# Patient Record
Sex: Male | Born: 1990 | Race: Black or African American | Hispanic: No | Marital: Single | Smoking: Current some day smoker
Health system: Southern US, Community
[De-identification: ages and names within clinical notes are randomized; demographics above are authoritative.]

## PROBLEM LIST (undated history)

## (undated) DIAGNOSIS — G44009 Cluster headache syndrome, unspecified, not intractable: Secondary | ICD-10-CM

## (undated) HISTORY — PX: MASTECTOMY: SHX3

---
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
## 2008-05-08 ENCOUNTER — Emergency Department (HOSPITAL_COMMUNITY): Admission: EM | Admit: 2008-05-08 | Discharge: 2008-05-08 | Payer: Self-pay | Admitting: Emergency Medicine

## 2008-05-18 ENCOUNTER — Emergency Department (HOSPITAL_COMMUNITY): Admission: EM | Admit: 2008-05-18 | Discharge: 2008-05-18 | Payer: Self-pay | Admitting: Emergency Medicine

## 2009-05-09 ENCOUNTER — Emergency Department (HOSPITAL_COMMUNITY): Admission: EM | Admit: 2009-05-09 | Discharge: 2009-05-09 | Payer: Self-pay | Admitting: Emergency Medicine

## 2010-04-12 LAB — URINALYSIS, ROUTINE W REFLEX MICROSCOPIC
Bilirubin Urine: NEGATIVE
Glucose, UA: NEGATIVE mg/dL
Hgb urine dipstick: NEGATIVE
Ketones, ur: NEGATIVE mg/dL
Nitrite: NEGATIVE
Protein, ur: NEGATIVE mg/dL
Specific Gravity, Urine: 1.024 (ref 1.005–1.030)
Urobilinogen, UA: 1 mg/dL (ref 0.0–1.0)
pH: 6 (ref 5.0–8.0)

## 2010-04-12 LAB — GC/CHLAMYDIA PROBE AMP, GENITAL
Chlamydia, DNA Probe: NEGATIVE
GC Probe Amp, Genital: NEGATIVE

## 2010-04-12 LAB — RPR: RPR Ser Ql: NONREACTIVE

## 2010-05-04 LAB — RAPID URINE DRUG SCREEN, HOSP PERFORMED
Amphetamines: NOT DETECTED
Barbiturates: NOT DETECTED
Opiates: NOT DETECTED

## 2010-10-12 ENCOUNTER — Emergency Department (HOSPITAL_COMMUNITY): Payer: Self-pay

## 2010-10-12 ENCOUNTER — Emergency Department (HOSPITAL_COMMUNITY)
Admission: EM | Admit: 2010-10-12 | Discharge: 2010-10-12 | Disposition: A | Discharge status: Home / Self Care | Payer: No Typology Code available for payment source | Attending: Emergency Medicine | Admitting: Emergency Medicine

## 2010-10-12 DIAGNOSIS — M25519 Pain in unspecified shoulder: Secondary | ICD-10-CM | POA: Insufficient documentation
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
## 2015-08-15 ENCOUNTER — Encounter (HOSPITAL_COMMUNITY): Payer: Self-pay | Admitting: Emergency Medicine

## 2015-08-15 DIAGNOSIS — F17200 Nicotine dependence, unspecified, uncomplicated: Secondary | ICD-10-CM | POA: Insufficient documentation

## 2015-08-15 DIAGNOSIS — R42 Dizziness and giddiness: Secondary | ICD-10-CM | POA: Insufficient documentation

## 2015-08-15 LAB — COMPREHENSIVE METABOLIC PANEL
ALT: 27 U/L (ref 17–63)
AST: 28 U/L (ref 15–41)
Albumin: 4.2 g/dL (ref 3.5–5.0)
Alkaline Phosphatase: 69 U/L (ref 38–126)
Anion gap: 6 (ref 5–15)
BUN: 9 mg/dL (ref 6–20)
CHLORIDE: 106 mmol/L (ref 101–111)
CO2: 27 mmol/L (ref 22–32)
CREATININE: 1.13 mg/dL (ref 0.61–1.24)
Calcium: 9.6 mg/dL (ref 8.9–10.3)
GFR calc Af Amer: >60 mL/min (ref >60)
Glucose, Bld: 97 mg/dL (ref 65–99)
POTASSIUM: 3.5 mmol/L (ref 3.5–5.1)
Sodium: 139 mmol/L (ref 135–145)
Total Bilirubin: 0.6 mg/dL (ref 0.3–1.2)
Total Protein: 8 g/dL (ref 6.5–8.1)

## 2015-08-15 LAB — CBC WITH DIFFERENTIAL/PLATELET
BASOS ABS: 0 10*3/uL (ref 0.0–0.1)
Basophils Relative: 0 %
Eosinophils Absolute: 0.3 10*3/uL (ref 0.0–0.7)
Eosinophils Relative: 3 %
HEMATOCRIT: 43.8 % (ref 39.0–52.0)
HEMOGLOBIN: 14.3 g/dL (ref 13.0–17.0)
LYMPHS PCT: 30 %
Lymphs Abs: 3 10*3/uL (ref 0.7–4.0)
MCH: 28.5 pg (ref 26.0–34.0)
MCHC: 32.6 g/dL (ref 30.0–36.0)
MCV: 87.4 fL (ref 78.0–100.0)
Monocytes Absolute: 0.6 10*3/uL (ref 0.1–1.0)
Monocytes Relative: 6 %
NEUTROS ABS: 6 10*3/uL (ref 1.7–7.7)
NEUTROS PCT: 61 %
Platelets: 282 10*3/uL (ref 150–400)
RBC: 5.01 MIL/uL (ref 4.22–5.81)
RDW: 13.1 % (ref 11.5–15.5)
WBC: 9.9 10*3/uL (ref 4.0–10.5)

## 2015-08-15 LAB — URINALYSIS, ROUTINE W REFLEX MICROSCOPIC
BILIRUBIN URINE: NEGATIVE
Glucose, UA: NEGATIVE mg/dL
HGB URINE DIPSTICK: NEGATIVE
Ketones, ur: NEGATIVE mg/dL
Leukocytes, UA: NEGATIVE
NITRITE: NEGATIVE
PH: 6 (ref 5.0–8.0)
Protein, ur: NEGATIVE mg/dL
SPECIFIC GRAVITY, URINE: 1.03 (ref 1.005–1.030)

## 2015-08-15 NOTE — ED Triage Notes (Signed)
Patient reports intermittent lightheaded / dizziness with nausea onset 2 weeks ago , denies emesis . No fever .

## 2015-08-16 ENCOUNTER — Emergency Department (HOSPITAL_COMMUNITY)
Admission: EM | Admit: 2015-08-16 | Discharge: 2015-08-16 | Disposition: A | Discharge status: Home / Self Care | Payer: Self-pay | Attending: Emergency Medicine | Admitting: Emergency Medicine

## 2015-08-16 DIAGNOSIS — R42 Dizziness and giddiness: Secondary | ICD-10-CM

## 2015-08-16 MED ORDER — SODIUM CHLORIDE 0.9 % IV BOLUS (SEPSIS)
1000.0000 mL | Freq: Once | INTRAVENOUS | Status: AC
  Administered 2015-08-16: 1000 mL via INTRAVENOUS

## 2015-08-16 NOTE — Discharge Instructions (Signed)
FOLLOW UP HERE WITH ANY WORSENING OR RECURRENT SYMPTOMS OF LIGHTHEADEDNESS/DIZZINESS. PUSH FLUIDS - NON-CAFFEINATED.

## 2015-08-30 NOTE — ED Provider Notes (Signed)
[facility]-EMERGENCY DEPT Provider Note   CSN: [id number] Arrival date & time: 08/15/15  2141  First Provider Contact:  First MD Initiated Contact with Patient 08/16/15 [phone]        History   Chief Complaint Chief Complaint  Patient presents with  . Dizziness    HPI Clarence Henderson is a 25 y.o. male.  Patient presents in the ED with complaint of dizziness and lightheadedness x 2 weeks, waxing and waning. No fever, nausea or vomiting. No pain. He denies urinary complaints, diarrhea or headache. No visual changes.    The history is provided by the patient. No language interpreter was used.    History reviewed. No pertinent past medical history.  There are no active problems to display for this patient.   History reviewed. No pertinent surgical history.     Home Medications    Prior to Admission medications   Not on File    Family History No family history on file.  Social History Social History  Substance Use Topics  . Smoking status: Current Some Day Smoker  . Smokeless tobacco: Not on file  . Alcohol use Yes     Allergies   Review of patient's allergies indicates no known allergies.   Review of Systems Review of Systems  Constitutional: Negative for chills and fever.  Respiratory: Negative.  Negative for shortness of breath.   Cardiovascular: Negative.  Negative for chest pain.  Gastrointestinal: Negative.   Genitourinary: Negative.   Musculoskeletal: Negative.   Skin: Negative.   Neurological: Positive for dizziness. Negative for syncope and weakness.     Physical Exam Updated Vital Signs BP 124/79 (BP Location: Left Arm)   Pulse 82   Temp 98.5 F (36.9 C) (Oral)   Resp 23   Ht 6' (1.829 m)   Wt 133.8 kg   SpO2 100%   BMI 40.01 kg/m   Physical Exam  Constitutional: He is oriented to person, place, and time. He appears well-developed and well-nourished.  HENT:  Head: Normocephalic.  Mouth/Throat: Mucous membranes are dry.  Neck:  Normal range of motion. Neck supple.  Cardiovascular: Normal rate and regular rhythm.   Pulmonary/Chest: Effort normal and breath sounds normal.  Abdominal: Soft. Bowel sounds are normal. There is no tenderness. There is no rebound and no guarding.  Musculoskeletal: Normal range of motion.  Neurological: He is alert and oriented to person, place, and time. Coordination normal.  CN's 3-12 grossly intact. Ambulatory without ataxia. Speech clear, focused and coherent.   Skin: Skin is warm and dry. No rash noted.  Psychiatric: He has a normal mood and affect.  Nursing note reviewed.    ED Treatments / Results  Labs (all labs ordered are listed, but only abnormal results are displayed) Labs Reviewed  CBC WITH DIFFERENTIAL/PLATELET  COMPREHENSIVE METABOLIC PANEL  URINALYSIS, ROUTINE W REFLEX MICROSCOPIC (NOT AT [facility])    EKG  EKG Interpretation None       Radiology No results found.  Procedures Procedures (including critical care time)  Medications Ordered in ED Medications  sodium chloride 0.9 % bolus 1,000 mL (0 mLs Intravenous Stopped 08/16/15 0612)     Initial Impression / Assessment and Plan / ED Course  I have reviewed the triage vital signs and the nursing notes.  Pertinent labs & imaging results that were available during my care of the patient were reviewed by me and considered in my medical decision making (see chart for details).  Clinical Course  Comment By Time  Pt  not in the room at 12:27.  Elpidio AnisShari Chaston Bradburn, PA-C 07/24 808 [phone]  Patient not in the room at 1:00 Elpidio AnisShari Milderd Manocchio, PA-C 07/24 0120  Patient in room and initially seen at 1:40 Elpidio AnisShari Marlyne Totaro, PA-C 07/24 0158    Patient arrives with complaint of dizziness and lightheadedness without syncope or pain. He is given IVF's with relief of his symptoms. He has no neurologic deficits on exam. He is ambulating without imbalance or difficulty. He is felt stable for discharge home.   Final Clinical Impressions(s) / ED  Diagnoses   Final diagnoses:  Dizziness    New Prescriptions There are no discharge medications for this patient.    Elpidio AnisShari Nikea Settle, PA-C 08/30/15 [phone]    Shon Batonourtney F Horton, MD 09/05/15 [phone]

## 2018-12-02 ENCOUNTER — Other Ambulatory Visit: Payer: Self-pay

## 2018-12-02 DIAGNOSIS — Z20822 Contact with and (suspected) exposure to covid-19: Secondary | ICD-10-CM

## 2018-12-05 LAB — NOVEL CORONAVIRUS, NAA: SARS-CoV-2, NAA: NOT DETECTED

## 2019-03-10 ENCOUNTER — Ambulatory Visit: Payer: Self-pay | Attending: Internal Medicine

## 2019-03-10 DIAGNOSIS — U071 COVID-19: Secondary | ICD-10-CM | POA: Insufficient documentation

## 2019-03-10 DIAGNOSIS — Z20822 Contact with and (suspected) exposure to covid-19: Secondary | ICD-10-CM

## 2019-03-11 LAB — NOVEL CORONAVIRUS, NAA: SARS-CoV-2, NAA: DETECTED — AB

## 2019-09-04 ENCOUNTER — Ambulatory Visit (INDEPENDENT_AMBULATORY_CARE_PROVIDER_SITE_OTHER): Payer: BC Managed Care – PPO

## 2019-09-04 ENCOUNTER — Other Ambulatory Visit: Payer: Self-pay | Admitting: Sports Medicine

## 2019-09-04 ENCOUNTER — Ambulatory Visit (INDEPENDENT_AMBULATORY_CARE_PROVIDER_SITE_OTHER): Payer: BC Managed Care – PPO | Admitting: Sports Medicine

## 2019-09-04 ENCOUNTER — Encounter: Payer: Self-pay | Admitting: Sports Medicine

## 2019-09-04 ENCOUNTER — Other Ambulatory Visit: Payer: Self-pay

## 2019-09-04 DIAGNOSIS — M216X2 Other acquired deformities of left foot: Secondary | ICD-10-CM

## 2019-09-04 DIAGNOSIS — M216X1 Other acquired deformities of right foot: Secondary | ICD-10-CM | POA: Diagnosis not present

## 2019-09-04 DIAGNOSIS — M79671 Pain in right foot: Secondary | ICD-10-CM | POA: Diagnosis not present

## 2019-09-04 DIAGNOSIS — M722 Plantar fascial fibromatosis: Secondary | ICD-10-CM

## 2019-09-04 MED ORDER — MELOXICAM 15 MG PO TABS: 15.0000 mg | ORAL_TABLET | Freq: Every day | ORAL | 0 refills | Status: DC

## 2019-09-04 MED ORDER — TRIAMCINOLONE ACETONIDE 10 MG/ML IJ SUSP
10.0000 mg | Freq: Once | INTRAMUSCULAR | Status: AC
  Administered 2019-09-04: 10 mg

## 2019-09-04 NOTE — Progress Notes (Signed)
Subjective: Clarence Henderson is a 29 y.o. male patient presents to office with complaint of moderate heel pain on the right since March 2021. 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  Patient reports that he bought over-the-counter insoles which made the pain worse and also got a fascial sleeve that seems to help however after periods of rest and after football the pain is worse.  Review of Systems  All other systems reviewed and are negative.    There are no problems to display for this patient.   No current outpatient medications on file prior to visit.   No current facility-administered medications on file prior to visit.    No Known Allergies  Objective: Physical Exam General: The patient is alert and oriented x3 in no acute distress.  Dermatology: Skin is warm, dry and supple bilateral lower extremities. Nails 1-10 are normal. There is no erythema, edema, no eccymosis, no open lesions present. Integument is otherwise unremarkable.  Vascular: Dorsalis Pedis pulse and Posterior Tibial pulse are 2/4 bilateral. Capillary fill time is immediate to all digits.  Neurological: Grossly intact to light touch bilateral.  Musculoskeletal: Tenderness to palpation at the medial calcaneal tubercale and through the insertion of the plantar fascia on the Right foot. No pain with compression of calcaneus bilateral. No pain with tuning fork to calcaneus bilateral. No pain with calf compression bilateral. There is decreased Ankle joint range of motion bilateral.  Pes planus foot type.  All other joints range of motion within normal limits bilateral. Strength 5/5 in all groups bilateral.    Xray, Right foot: Normal osseous mineralization. Joint spaces preserved. No fracture/dislocation/boney destruction. No calcaneal spur present with mild thickening of plantar fascia. No other soft tissue abnormalities or radiopaque  foreign bodies.   Assessment and Plan: Problem List Items Addressed This Visit    None    Visit Diagnoses    Pain in right foot    -  Primary   Relevant Orders   DG Foot Complete Right   Plantar fasciitis of right foot       Relevant Medications   meloxicam (MOBIC) 15 MG tablet   triamcinolone acetonide (KENALOG) 10 MG/ML injection 10 mg (Start on 09/04/2019  4:45 PM)   Acquired equinus deformity of both feet          -Complete examination performed.  -Xrays reviewed -Discussed with patient in detail the condition of plantar fasciitis, how this occurs and general treatment options. Explained both conservative and surgical treatments.  -After oral consent and aseptic prep, injected a mixture containing 1 ml of 2%  plain lidocaine, 1 ml 0.5% plain marcaine, 0.5 ml of kenalog 10 and 0.5 ml of dexamethasone phosphate into right heel. Post-injection care discussed with patient.  -Rx Meloxicam -Patient to continue with plantar fascial sleeve that he already has.  -Explained and dispensed to patient daily stretching exercises. -Recommend patient to ice affected area 1-2x daily. -Patient to return to office in 4-5 weeks for follow up or sooner if pro pain blems or questions arise.  Asencion Islam, DPM

## 2019-09-04 NOTE — Patient Instructions (Addendum)

## 2019-10-09 ENCOUNTER — Ambulatory Visit: Payer: BC Managed Care – PPO | Admitting: Sports Medicine

## 2019-10-30 ENCOUNTER — Ambulatory Visit: Payer: Self-pay | Admitting: Sports Medicine

## 2021-05-12 ENCOUNTER — Ambulatory Visit (INDEPENDENT_AMBULATORY_CARE_PROVIDER_SITE_OTHER): Payer: No Typology Code available for payment source | Admitting: Family Medicine

## 2021-05-12 ENCOUNTER — Encounter (HOSPITAL_BASED_OUTPATIENT_CLINIC_OR_DEPARTMENT_OTHER): Payer: Self-pay | Admitting: Family Medicine

## 2021-05-12 VITALS — BP 136/97 | HR 88 | Temp 98.6°F | Ht 74.0 in | Wt 295.0 lb

## 2021-05-12 DIAGNOSIS — Z0000 Encounter for general adult medical examination without abnormal findings: Secondary | ICD-10-CM

## 2021-05-12 NOTE — Progress Notes (Signed)
? ?New Patient Office Visit ? ?Subjective   ? ?Patient ID: Clarence Henderson, male    DOB: 1990/04/30  Age: 31 y.o. MRN: [id number] ? ?CC: No chief complaint on file. ? ? ?HPI ?Clarence Henderson presents to establish care. ? ?He reports that his last PCP visit was about 2 and half to 3 years ago.  Denies any chronic medical issues, was not following up for any specific issues ?He would like to have labs completed, complete physical ?Patient is originally from [place], has been living here for about 20 years.  He works as a Secretary/administrator.  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 ?Reports some family history of blood pressure issues he thinks, possible blood sugar issues in grandparent.  Not aware of any specific cancers in the family. ? ?Outpatient Encounter Medications as of 05/12/2021  ?Medication Sig  ? meloxicam (MOBIC) 15 MG tablet Take 1 tablet (15 mg total) by mouth daily.  ? ?No facility-administered encounter medications on file as of 05/12/2021.  ? ? ?History reviewed. No pertinent past medical history. ? ?History reviewed. No pertinent surgical history. ? ?History reviewed. No pertinent family history. ? ?Social History  ? ?Socioeconomic History  ? Marital status: Single  ?  Spouse name: Not on file  ? Number of children: Not on file  ? Years of education: Not on file  ? Highest education level: Not on file  ?Occupational History  ? Not on file  ?Tobacco Use  ? Smoking status: Some Days  ? Smokeless tobacco: Not on file  ?Substance and Sexual Activity  ? Alcohol use: Yes  ? Drug use: No  ? Sexual activity: Not on file  ?Other Topics Concern  ? Not on file  ?Social History Narrative  ? Not on file  ? ?Social Determinants of Health  ? ?Financial Resource Strain: Not on file  ?Food Insecurity: Not on file  ?Transportation Needs: Not on file  ?Physical Activity: Not on file  ?Stress: Not on file  ?Social Connections: Not on file  ?Intimate Partner Violence: Not on  file  ? ? ?Objective   ? ?BP (!) 136/97   Pulse 88   Temp 98.6 ?F (37 ?C)   Ht 6\' 2"  (1.88 m)   Wt 295 lb (133.8 kg)   SpO2 99%   BMI 37.88 kg/m?  ? ?Physical Exam ?Constitutional:   ?   General: He is not in acute distress. ?   Appearance: He is obese.  ?HENT:  ?   Head: Normocephalic and atraumatic.  ?   Right Ear: Tympanic membrane, ear canal and external ear normal.  ?   Left Ear: Tympanic membrane, ear canal and external ear normal.  ?   Nose: Nose normal.  ?   Mouth/Throat:  ?   Mouth: Mucous membranes are moist.  ?   Pharynx: Oropharynx is clear.  ?Eyes:  ?   Extraocular Movements: Extraocular movements intact.  ?   Conjunctiva/sclera: Conjunctivae normal.  ?   Pupils: Pupils are equal, round, and reactive to light.  ?Cardiovascular:  ?   Rate and Rhythm: Normal rate and regular rhythm.  ?   Pulses: Normal pulses.  ?   Heart sounds: Normal heart sounds. No murmur heard. ?Pulmonary:  ?   Effort: Pulmonary effort is normal.  ?   Breath sounds: Normal breath sounds. No wheezing.  ?Abdominal:  ?   General: Bowel sounds are normal. There is no distension.  ?  Palpations: Abdomen is soft.  ?   Tenderness: There is no abdominal tenderness. There is no guarding.  ?Musculoskeletal:  ?   Cervical back: Normal range of motion. No tenderness.  ?Skin: ?   General: Skin is warm.  ?   Capillary Refill: Capillary refill takes less than 2 seconds.  ?   Coloration: Skin is not jaundiced.  ?   Findings: No rash.  ?Neurological:  ?   General: No focal deficit present.  ?   Mental Status: He is alert and oriented to person, place, and time.  ?   Gait: Gait normal.  ?Psychiatric:     ?   Mood and Affect: Mood normal.     ?   Behavior: Behavior normal.  ? ? ?Assessment & Plan:  ? ?Problem List Items Addressed This Visit   ? ?  ? Other  ? Wellness examination - Primary  ?  Routine HCM labs ordered. HCM reviewed/discussed. Anticipatory guidance regarding healthy weight, lifestyle and choices given. ?Recommend healthy diet.   Recommend approximately 150 minutes/week of moderate intensity exercise ?Recommend regular dental and vision exams ?Always use seatbelt/lap and shoulder restraints ?Recommend using smoke alarms and checking batteries at least twice a year ?Recommend using sunscreen when outside ?[facility] - reports receiving this in 2015 ?  ?  ? Relevant Orders  ? CBC with Differential/Platelet  ? Comprehensive metabolic panel  ? Hemoglobin A1c  ? Lipid panel  ? TSH Rfx on Abnormal to Free T4  ? ? ?Return in about 1 year (around 05/13/2022).  Or sooner as needed or as indicated by laboratory evaluation ? ?Ramsey Guadamuz J De Peru, MD ? ? ?

## 2021-05-12 NOTE — Assessment & Plan Note (Signed)
Routine HCM labs ordered. HCM reviewed/discussed. Anticipatory guidance regarding healthy weight, lifestyle and choices given. ?Recommend healthy diet.  Recommend approximately 150 minutes/week of moderate intensity exercise ?Recommend regular dental and vision exams ?Always use seatbelt/lap and shoulder restraints ?Recommend using smoke alarms and checking batteries at least twice a year ?Recommend using sunscreen when outside ?[employer] - reports receiving this in 2015 ?

## 2021-05-13 LAB — CBC WITH DIFFERENTIAL/PLATELET
Basophils Absolute: 0.1 10*3/uL (ref 0.0–0.2)
Basos: 1 %
EOS (ABSOLUTE): 0.1 10*3/uL (ref 0.0–0.4)
Eos: 1 %
Hematocrit: 43 % (ref 37.5–51.0)
Hemoglobin: 14.5 g/dL (ref 13.0–17.7)
Immature Grans (Abs): 0 10*3/uL (ref 0.0–0.1)
Immature Granulocytes: 0 %
Lymphocytes Absolute: 2 10*3/uL (ref 0.7–3.1)
Lymphs: 25 %
MCH: 28.4 pg (ref 26.6–33.0)
MCHC: 33.7 g/dL (ref 31.5–35.7)
MCV: 84 fL (ref 79–97)
Monocytes Absolute: 0.6 10*3/uL (ref 0.1–0.9)
Monocytes: 8 %
Neutrophils Absolute: 5.1 10*3/uL (ref 1.4–7.0)
Neutrophils: 65 %
Platelets: 259 10*3/uL (ref 150–450)
RBC: 5.1 x10E6/uL (ref 4.14–5.80)
RDW: 12.2 % (ref 11.6–15.4)
WBC: 7.8 10*3/uL (ref 3.4–10.8)

## 2021-05-13 LAB — COMPREHENSIVE METABOLIC PANEL
ALT: 20 IU/L (ref 0–44)
AST: 25 IU/L (ref 0–40)
Albumin/Globulin Ratio: 1.4 (ref 1.2–2.2)
Albumin: 4.3 g/dL (ref 4.0–5.0)
Alkaline Phosphatase: 70 IU/L (ref 44–121)
BUN/Creatinine Ratio: 8 — ABNORMAL LOW (ref 9–20)
BUN: 9 mg/dL (ref 6–20)
Bilirubin Total: 0.4 mg/dL (ref 0.0–1.2)
CO2: 24 mmol/L (ref 20–29)
Calcium: 9.2 mg/dL (ref 8.7–10.2)
Chloride: 103 mmol/L (ref 96–106)
Creatinine, Ser: 1.17 mg/dL (ref 0.76–1.27)
Globulin, Total: 3.1 g/dL (ref 1.5–4.5)
Glucose: 80 mg/dL (ref 70–99)
Potassium: 4.2 mmol/L (ref 3.5–5.2)
Sodium: 139 mmol/L (ref 134–144)
Total Protein: 7.4 g/dL (ref 6.0–8.5)
eGFR: 84 mL/min/{1.73_m2} (ref >59)

## 2021-05-13 LAB — LIPID PANEL
Chol/HDL Ratio: 5.6 ratio — ABNORMAL HIGH (ref 0.0–5.0)
Cholesterol, Total: 222 mg/dL — ABNORMAL HIGH (ref 100–199)
HDL: 40 mg/dL (ref >39)
LDL Chol Calc (NIH): 167 mg/dL — ABNORMAL HIGH (ref 0–99)
Triglycerides: 85 mg/dL (ref 0–149)
VLDL Cholesterol Cal: 15 mg/dL (ref 5–40)

## 2021-05-13 LAB — HEMOGLOBIN A1C
Est. average glucose Bld gHb Est-mCnc: 103 mg/dL
Hgb A1c MFr Bld: 5.2 % (ref 4.8–5.6)

## 2021-05-13 LAB — TSH RFX ON ABNORMAL TO FREE T4: TSH: 1.74 u[IU]/mL (ref 0.450–4.500)

## 2021-05-19 ENCOUNTER — Telehealth (HOSPITAL_BASED_OUTPATIENT_CLINIC_OR_DEPARTMENT_OTHER): Payer: Self-pay

## 2021-05-19 NOTE — Telephone Encounter (Signed)
Pt called back and was notified about his lab results. Pt understood and was told to call back if he has any other questions or concerns.  ?

## 2021-05-19 NOTE — Progress Notes (Signed)
Pt called back today in regards to his lab results he was notified about them and recommendations on what to do to keep it under control. Pt stated he understood and will call back if he has any questions or concerns.  ?   ?

## 2022-05-15 ENCOUNTER — Encounter (HOSPITAL_BASED_OUTPATIENT_CLINIC_OR_DEPARTMENT_OTHER): Payer: No Typology Code available for payment source | Admitting: Family Medicine

## 2022-06-24 LAB — GLUCOSE, POCT (MANUAL RESULT ENTRY): Glucose Fasting, POC: 93 mg/dL (ref 70–99)

## 2022-06-26 NOTE — Progress Notes (Unsigned)
Patient states Dr. Michaell Cowing PCP. No SDOH needs at this time.

## 2022-07-31 ENCOUNTER — Encounter: Payer: Self-pay | Admitting: *Deleted

## 2022-07-31 NOTE — Progress Notes (Unsigned)
Pt attended 06/24/22 screening event where his b/p was 130/96 and his blood sugar was 93 and where pt did not indicated any SDOH insecurities. At the event, the pt documented Dr. Michaell Cowing as his PCP;however, chart review indicated Dr. Ceasar Mons Peru was pt's PCP at [facility], where he was last seen on 05/12/21. During that visit pt's b/p was also elevated at 136/97. Calls made to determine if pt does indeed have access to a current PCP and whether he has been able to share his event b/p, which was elevated.

## 2023-03-21 ENCOUNTER — Encounter (HOSPITAL_COMMUNITY): Payer: Self-pay | Admitting: Emergency Medicine

## 2023-03-21 ENCOUNTER — Other Ambulatory Visit: Payer: Self-pay

## 2023-03-21 ENCOUNTER — Other Ambulatory Visit (HOSPITAL_BASED_OUTPATIENT_CLINIC_OR_DEPARTMENT_OTHER): Payer: Self-pay

## 2023-03-21 ENCOUNTER — Emergency Department (HOSPITAL_COMMUNITY)
Admission: EM | Admit: 2023-03-21 | Discharge: 2023-03-21 | Disposition: A | Discharge status: Home / Self Care | Payer: Medicaid Other | Attending: Emergency Medicine | Admitting: Emergency Medicine

## 2023-03-21 ENCOUNTER — Encounter (HOSPITAL_BASED_OUTPATIENT_CLINIC_OR_DEPARTMENT_OTHER): Payer: Self-pay

## 2023-03-21 ENCOUNTER — Other Ambulatory Visit (HOSPITAL_COMMUNITY): Payer: Self-pay

## 2023-03-21 ENCOUNTER — Emergency Department (HOSPITAL_BASED_OUTPATIENT_CLINIC_OR_DEPARTMENT_OTHER)
Admission: EM | Admit: 2023-03-21 | Discharge: 2023-03-21 | Disposition: A | Discharge status: Home / Self Care | Payer: Medicaid Other | Attending: Emergency Medicine | Admitting: Emergency Medicine

## 2023-03-21 DIAGNOSIS — G44009 Cluster headache syndrome, unspecified, not intractable: Secondary | ICD-10-CM | POA: Insufficient documentation

## 2023-03-21 DIAGNOSIS — R519 Headache, unspecified: Secondary | ICD-10-CM | POA: Insufficient documentation

## 2023-03-21 DIAGNOSIS — Z5321 Procedure and treatment not carried out due to patient leaving prior to being seen by health care provider: Secondary | ICD-10-CM | POA: Insufficient documentation

## 2023-03-21 HISTORY — DX: Cluster headache syndrome, unspecified, not intractable: G44.009

## 2023-03-21 LAB — COMPREHENSIVE METABOLIC PANEL
ALT: 13 U/L (ref 0–44)
AST: 16 U/L (ref 15–41)
Albumin: 4.5 g/dL (ref 3.5–5.0)
Alkaline Phosphatase: 56 U/L (ref 38–126)
Anion gap: 5 (ref 5–15)
BUN: 15 mg/dL (ref 6–20)
CO2: 29 mmol/L (ref 22–32)
Calcium: 9.2 mg/dL (ref 8.9–10.3)
Chloride: 104 mmol/L (ref 98–111)
Creatinine, Ser: 1.23 mg/dL (ref 0.61–1.24)
GFR, Estimated: >60 mL/min (ref >60)
Glucose, Bld: 95 mg/dL (ref 70–99)
Potassium: 3.9 mmol/L (ref 3.5–5.1)
Sodium: 138 mmol/L (ref 135–145)
Total Bilirubin: 0.4 mg/dL (ref 0.0–1.2)
Total Protein: 7.9 g/dL (ref 6.5–8.1)

## 2023-03-21 LAB — CBC WITH DIFFERENTIAL/PLATELET
Abs Immature Granulocytes: 0.02 10*3/uL (ref 0.00–0.07)
Basophils Absolute: 0 10*3/uL (ref 0.0–0.1)
Basophils Relative: 0 %
Eosinophils Absolute: 0 10*3/uL (ref 0.0–0.5)
Eosinophils Relative: 0 %
HCT: 44.9 % (ref 39.0–52.0)
Hemoglobin: 14.7 g/dL (ref 13.0–17.0)
Immature Granulocytes: 0 %
Lymphocytes Relative: 16 %
Lymphs Abs: 1.4 10*3/uL (ref 0.7–4.0)
MCH: 28.6 pg (ref 26.0–34.0)
MCHC: 32.7 g/dL (ref 30.0–36.0)
MCV: 87.4 fL (ref 80.0–100.0)
Monocytes Absolute: 0.5 10*3/uL (ref 0.1–1.0)
Monocytes Relative: 6 %
Neutro Abs: 7 10*3/uL (ref 1.7–7.7)
Neutrophils Relative %: 78 %
Platelets: 266 10*3/uL (ref 150–400)
RBC: 5.14 MIL/uL (ref 4.22–5.81)
RDW: 12.6 % (ref 11.5–15.5)
WBC: 9 10*3/uL (ref 4.0–10.5)
nRBC: 0 % (ref 0.0–0.2)

## 2023-03-21 MED ORDER — DEXAMETHASONE SODIUM PHOSPHATE 10 MG/ML IJ SOLN
10.0000 mg | Freq: Once | INTRAMUSCULAR | Status: AC
Start: 2023-03-21 — End: 2023-03-21
  Administered 2023-03-21: 10 mg via INTRAVENOUS
  Filled 2023-03-21: qty 1

## 2023-03-21 MED ORDER — NAPROXEN 375 MG PO TABS
375.0000 mg | ORAL_TABLET | Freq: Two times a day (BID) | ORAL | 0 refills | Status: AC | PRN
  Filled 2023-03-21 – 2023-04-05 (×2): qty 20, 10d supply, fill #0

## 2023-03-21 MED ORDER — SODIUM CHLORIDE 0.9 % IV BOLUS
1000.0000 mL | Freq: Once | INTRAVENOUS | Status: AC
  Administered 2023-03-21: 1000 mL via INTRAVENOUS

## 2023-03-21 MED ORDER — NAPROXEN 375 MG PO TABS
375.0000 mg | ORAL_TABLET | Freq: Two times a day (BID) | ORAL | 0 refills | Status: DC | PRN
  Filled 2023-03-21: qty 20, 10d supply, fill #0

## 2023-03-21 NOTE — ED Triage Notes (Signed)
 Pt presents via POV c/o "cluster headache" x3 weeks.

## 2023-03-21 NOTE — ED Triage Notes (Signed)
 Pt c/o cluster headaches x 3 weeks. States that they last about 20 minutes and then goes away for a few hours before returning again.

## 2023-03-21 NOTE — ED Notes (Signed)
 The doctor ordered a NRB for the Pt due to the Pt having a headache.

## 2023-03-21 NOTE — ED Provider Notes (Signed)
 [place] EM[facility] DEPARTMENT AT [facility] Provider Note   CSN: [id number] Arrival date & time: 03/21/23  8295     History  Chief Complaint  Patient presents with   Headache    Clarence Henderson is a 33 y.o. male.  HPI 33 year old male presents with headaches.  He states he has been having cluster headaches on and off for years.  Usually they will come and go and last about a week.  He will get 1-2 episodes per day lasting about 20 minutes of pain behind his left eye and some tearing.  This has been going on this time for 3 weeks which has happened before but not often.  The headache feels the same as when he has had his chronic headaches.  He is taken some Excedrin which he thinks might of helped temporarily.  No fevers or vision changes.  No weakness.  Last headache was around 430 this morning but no current headache.  He used to take some medicine but cannot member what is called.  Home Medications Prior to Admission medications   Medication Sig Start Date End Date Taking? Authorizing Provider  naproxen (NAPROSYN) 375 MG tablet Take 1 tablet (375 mg total) by mouth 2 (two) times daily as needed for headache. 03/21/23   Pricilla Loveless, MD      Allergies    Patient has no known allergies.    Review of Systems   Review of Systems  Eyes:  Positive for photophobia. Negative for visual disturbance.  Neurological:  Positive for headaches. Negative for weakness.    Physical Exam Updated Vital Signs BP (!) 125/95   Pulse 89   Temp 98.5 F (36.9 C) (Oral)   Resp 16   SpO2 97%  Physical Exam Vitals and nursing note reviewed.  Constitutional:      General: He is not in acute distress.    Appearance: He is well-developed. He is not ill-appearing or diaphoretic.  HENT:     Head: Normocephalic and atraumatic.  Eyes:     Extraocular Movements: Extraocular movements intact.     Pupils: Pupils are equal, round, and reactive to light.  Cardiovascular:     Rate and  Rhythm: Normal rate and regular rhythm.     Heart sounds: Normal heart sounds.  Pulmonary:     Effort: Pulmonary effort is normal.  Musculoskeletal:     Cervical back: No rigidity.  Skin:    General: Skin is warm and dry.  Neurological:     Mental Status: He is alert.     Comments: CN 3-12 grossly intact. 5/5 strength in all 4 extremities. Grossly normal sensation. Normal finger to nose.      ED Results / Procedures / Treatments   Labs (all labs ordered are listed, but only abnormal results are displayed) Labs Reviewed  COMPREHENSIVE METABOLIC PANEL  CBC WITH DIFFERENTIAL/PLATELET    EKG None  Radiology No results found.  Procedures Procedures    Medications Ordered in ED Medications  sodium chloride 0.9 % bolus 1,000 mL (0 mLs Intravenous Stopped 03/21/23 0858)  dexamethasone (DECADRON) injection 10 mg (10 mg Intravenous Given 03/21/23 0758)    ED Course/ Medical Decision Making/ A&P                                 Medical Decision Making Amount and/or Complexity of Data Reviewed External Data Reviewed: notes. Labs: ordered.    Details:  Normal WBC and normal labs  Risk Prescription drug management.   Patient has remained asymptomatic.  He states he is also at the same time feeling a little better.  He was given a dose of Decadron to hopefully help prevent some recurrence.  I will have him follow-up with neurology for possible outpatient preventative medications/care but otherwise this is a recurrent problem for him and he has a benign exam and so I do not think imaging will be helpful or needed.  Will discharge home with return precautions.  Labs are unremarkable.        Final Clinical Impression(s) / ED Diagnoses Final diagnoses:  Cluster headache, not intractable, unspecified chronicity pattern    Rx / DC Orders ED Discharge Orders          Ordered    Ambulatory referral to Neurology       Comments: An appointment is requested in approximately: 2  weeks   03/21/23 0915    naproxen (NAPROSYN) 375 MG tablet  2 times daily PRN,   Status:  Discontinued        03/21/23 0916    naproxen (NAPROSYN) 375 MG tablet  2 times daily PRN        03/21/23 0946              Pricilla Loveless, MD 03/21/23 1003

## 2023-03-21 NOTE — Discharge Instructions (Addendum)
 Prescribing you naproxen to use if you develop recurrent headaches.  This is an NSAID, do not take other NSAIDs such as ibuprofen, Advil, Aleve, etc.  You may still take Tylenol.  If you develop continued, recurrent, or worsening headache, fever, neck stiffness, vomiting, blurry or double vision, weakness or numbness in your arms or legs, trouble speaking, or any other new/concerning symptoms then return to the ER for evaluation.

## 2023-04-02 ENCOUNTER — Other Ambulatory Visit (HOSPITAL_BASED_OUTPATIENT_CLINIC_OR_DEPARTMENT_OTHER): Payer: Self-pay

## 2023-04-03 ENCOUNTER — Encounter: Payer: Self-pay | Admitting: Neurology

## 2023-04-05 ENCOUNTER — Ambulatory Visit (HOSPITAL_BASED_OUTPATIENT_CLINIC_OR_DEPARTMENT_OTHER): Admitting: Family Medicine

## 2023-04-05 ENCOUNTER — Other Ambulatory Visit (HOSPITAL_BASED_OUTPATIENT_CLINIC_OR_DEPARTMENT_OTHER): Payer: Self-pay

## 2023-04-05 ENCOUNTER — Encounter (HOSPITAL_BASED_OUTPATIENT_CLINIC_OR_DEPARTMENT_OTHER): Payer: Self-pay | Admitting: Family Medicine

## 2023-04-05 VITALS — BP 120/84 | HR 92 | Ht 74.0 in | Wt 296.2 lb

## 2023-04-05 DIAGNOSIS — Z0000 Encounter for general adult medical examination without abnormal findings: Secondary | ICD-10-CM

## 2023-04-05 DIAGNOSIS — R519 Headache, unspecified: Secondary | ICD-10-CM | POA: Insufficient documentation

## 2023-04-05 NOTE — Patient Instructions (Signed)
  Medication Instructions:  Your physician recommends that you continue on your current medications as directed. Please refer to the Current Medication list given to you today. --If you need a refill on any your medications before your next appointment, please call your pharmacy first. If no refills are authorized on file call the office.-- Lab Work: Your physician has recommended that you have lab work today: labs 1 week prior to physical If you have labs (blood work) drawn today and your tests are completely normal, you will receive your results via MyChart message OR a phone call from our staff.  Please ensure you check your voicemail in the event that you authorized detailed messages to be left on a delegated number. If you have any lab test that is abnormal or we need to change your treatment, we will call you to review the results.  Referrals/Procedures/Imaging: no  Follow-Up: Your next appointment:   Your physician recommends that you schedule a follow-up appointment in: 3 months with Dr. de Peru  You will receive a text message or e-mail with a link to a survey about your care and experience with [place] today! We would greatly appreciate your feedback!   Thanks for letting us be apart of your health journey!!  Primary Care and Sports Medicine   Dr. Ceasar Mons Peru   We encourage you to activate your patient portal called "MyChart".  Sign up information is provided on this After Visit Summary.  MyChart is used to connect with patients for Virtual Visits (Telemedicine).  Patients are able to view lab/test results, encounter notes, upcoming appointments, etc.  Non-urgent messages can be sent to your provider as well. To learn more about what you can do with MyChart, please visit --  [email or web address].

## 2023-04-05 NOTE — Assessment & Plan Note (Signed)
 Patient has been experiencing more issues with headaches, primarily related to increased frequency.  This has been over the past few weeks or so.  Ultimately, did lead to presentation in the emergency department.  Evaluation at that time indicated possible diagnosis of cluster headaches.  He was recommended to have evaluation with neurology.  He reports that he did schedule new patient visit, however soonest available appointment was not until July.  He is on a cancellation list currently. He has been utilizing anti-inflammatory as well as Excedrin occasionally.  Feels that he gets more relief with Excedrin.  He has been using this about once daily for at least a week or so. On exam, patient is in no acute distress, vital signs stable. Discussed options today.  Would be reasonable to continue with anti-inflammatory.  We discussed considerations related to Excedrin use and caution with this medication due to caffeine component which regular use can result in rebound headaches.  Would recommend to use Excedrin sparingly if able to with focus on anti-inflammatory and/or Tylenol use primarily to help with headaches as needed.  Did discuss possibility of referral to alternative neurology office if able to get in sooner.  If he does contact neurology office that has sooner availability, advised on letting us know which office and we can place new referral for patient

## 2023-04-05 NOTE — Progress Notes (Signed)
    Procedures performed today:    None.  Independent interpretation of notes and tests performed by another provider:   None.  Brief History, Exam, Impression, and Recommendations:    BP 120/84 (BP Location: Right Arm, Patient Position: Sitting, Cuff Size: Large)   Pulse 92   Ht 6\' 2"  (1.88 m)   Wt 296 lb 3.2 oz (134.4 kg)   SpO2 97%   BMI 38.03 kg/m   Nonintractable headache, unspecified chronicity pattern, unspecified headache type Assessment & Plan: Patient has been experiencing more issues with headaches, primarily related to increased frequency.  This has been over the past few weeks or so.  Ultimately, did lead to presentation in the emergency department.  Evaluation at that time indicated possible diagnosis of cluster headaches.  He was recommended to have evaluation with neurology.  He reports that he did schedule new patient visit, however soonest available appointment was not until July.  He is on a cancellation list currently. He has been utilizing anti-inflammatory as well as Excedrin occasionally.  Feels that he gets more relief with Excedrin.  He has been using this about once daily for at least a week or so. On exam, patient is in no acute distress, vital signs stable. Discussed options today.  Would be reasonable to continue with anti-inflammatory.  We discussed considerations related to Excedrin use and caution with this medication due to caffeine component which regular use can result in rebound headaches.  Would recommend to use Excedrin sparingly if able to with focus on anti-inflammatory and/or Tylenol use primarily to help with headaches as needed.  Did discuss possibility of referral to alternative neurology office if able to get in sooner.  If he does contact neurology office that has sooner availability, advised on letting us know which office and we can place new referral for patient   Wellness examination -     Lipid panel; Future -     TSH Rfx on Abnormal to  Free T4; Future -     Hemoglobin A1c; Future  Return in about 3 months (around 07/06/2023) for CPE with fasting labs 1 week prior.   ___________________________________________ [place] Canty de [place], MD, ABFM, CAQSM Primary Care and Sports Medicine [facility]

## 2023-04-16 ENCOUNTER — Encounter (HOSPITAL_BASED_OUTPATIENT_CLINIC_OR_DEPARTMENT_OTHER): Payer: Self-pay | Admitting: Family Medicine

## 2023-04-16 DIAGNOSIS — G44009 Cluster headache syndrome, unspecified, not intractable: Secondary | ICD-10-CM

## 2023-04-18 ENCOUNTER — Telehealth (HOSPITAL_BASED_OUTPATIENT_CLINIC_OR_DEPARTMENT_OTHER): Payer: Self-pay | Admitting: Family Medicine

## 2023-04-18 NOTE — Telephone Encounter (Signed)
 Received via in person  Made copies  Attached billing form  placed in providers box allowed 7-10 businees days for provider

## 2023-05-04 ENCOUNTER — Ambulatory Visit
Admission: RE | Admit: 2023-05-04 | Discharge: 2023-05-04 | Disposition: A | Discharge status: Home / Self Care | Source: Ambulatory Visit | Attending: Family Medicine | Admitting: Family Medicine

## 2023-05-04 DIAGNOSIS — G44009 Cluster headache syndrome, unspecified, not intractable: Secondary | ICD-10-CM

## 2023-05-04 MED ORDER — GADOPICLENOL 0.5 MMOL/ML IV SOLN
10.0000 mL | Freq: Once | INTRAVENOUS | Status: AC | PRN
  Administered 2023-05-04: 10 mL via INTRAVENOUS

## 2023-07-06 ENCOUNTER — Ambulatory Visit (HOSPITAL_BASED_OUTPATIENT_CLINIC_OR_DEPARTMENT_OTHER): Admitting: Family Medicine

## 2023-07-24 NOTE — Progress Notes (Deleted)
 NEUROLOGY CONSULTATION NOTE  Clarence Henderson MRN: [id number] DOB: [birth year]  Referring provider: Glendia Breeding, MD (ED referral) Primary care provider: Raymond de [place], MD  Reason for consult:  headache  Assessment/Plan:   ***   Subjective:  Clarence Henderson is a 33 year old ***-handed male who presents for headache.  History supplemented by ED and PCP's notes.  Onset:  *** Location:  left retro-orbital Quality:  *** Intensity:  ***/10 Aura:  *** Prodrome:  *** Postdrome:  *** Associated symptoms:  left eye lacrimation.  He denies associated unilateral numbness or weakness. 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  Past NSAIDS/analgesics:  *** Past abortive triptans:  *** Past abortive ergotamine:  *** Past muscle relaxants:  *** Past anti-emetic:  *** Past antihypertensive medications:  *** Past antidepressant medications:  *** Past anticonvulsant medications:  *** Past anti-CGRP:  *** Past vitamins/Herbal/Supplements:  *** Past antihistamines/decongestants:  *** Other past therapies:  ***  Current NSAIDS/analgesics:  naproxen  Current triptans:  *** Current ergotamine:  *** Current anti-emetic:  *** Current muscle relaxants:  *** Current Antihypertensive medications:  *** Current Antidepressant medications:  *** Current Anticonvulsant medications:  *** Current anti-CGRP:  *** Current Vitamins/Herbal/Supplements:  *** Current Antihistamines/Decongestants:  *** Other therapy:  *** Birth control:  *** Other medications:  ***   Caffeine:  *** Alcohol:  *** Smoker:  *** Diet:  *** Exercise:  *** Depression:  ***; Anxiety:  *** Other pain:  *** Sleep hygiene:  *** Family history of  headache:  ***      PAST MEDICAL HISTORY: Past Medical History:  Diagnosis Date   Cluster headaches     PAST SURGICAL HISTORY: No past surgical history on file.  MEDICATIONS: Current Outpatient Medications on File Prior to Visit  Medication Sig Dispense Refill   naproxen  (NAPROSYN ) 375 MG tablet Take 1 tablet (375 mg total) by mouth 2 (two) times daily as needed for headache. 20 tablet 0   No current facility-administered medications on file prior to visit.    ALLERGIES: No Known Allergies  FAMILY HISTORY: No family history on file.  Objective:  *** General: No acute distress.  Patient appears well-groomed.   Head:  Normocephalic/atraumatic Eyes:  fundi examined but not visualized Neck: supple, no paraspinal tenderness, full range of motion Back: No paraspinal tenderness Heart: regular rate and rhythm Lungs: Clear to auscultation bilaterally. Vascular: No carotid bruits. Neurological Exam: Mental status: alert and oriented to person, place, and time, speech fluent and not dysarthric, language intact. Cranial nerves: CN I: not tested CN II: pupils equal, round and reactive to light, visual fields intact CN III, IV, VI:  full range of motion, no nystagmus, no ptosis CN V: facial sensation intact. CN VII: upper and lower face symmetric CN VIII: hearing intact CN IX, X: gag intact, uvula midline CN XI: sternocleidomastoid and trapezius muscles intact CN XII: tongue midline Bulk & Tone: normal, no fasciculations. Motor:  muscle strength 5/5 throughout Sensation:  Pinprick, temperature and vibratory sensation intact. Deep Tendon Reflexes:  2+ throughout,  toes downgoing.   Finger to nose testing:  Without dysmetria.   Gait:  Normal station and stride.  Romberg negative.    Thank you for allowing me to take  part in the care of this patient.  Juliene Dunnings, DO  CC: Raymond de Peru, MD

## 2023-07-25 ENCOUNTER — Ambulatory Visit: Admitting: Neurology

## 2023-07-26 ENCOUNTER — Encounter: Payer: Self-pay | Admitting: Neurology
